# Patient Record
Sex: Male | Born: 1975 | Race: Black or African American | Hispanic: No | Marital: Single | Smoking: Current some day smoker
Health system: Southern US, Community
[De-identification: ages and names within clinical notes are randomized; demographics above are authoritative.]

---

## 2017-03-10 HISTORY — PX: CERVICAL DISC SURGERY: SHX588

## 2017-03-10 HISTORY — PX: CERVICAL SPINE SURGERY: SHX589

## 2020-03-07 ENCOUNTER — Other Ambulatory Visit: Payer: Self-pay | Admitting: Podiatry

## 2020-03-07 DIAGNOSIS — M79675 Pain in left toe(s): Secondary | ICD-10-CM

## 2020-03-16 NOTE — Patient Instructions (Signed)
Nathan Becker  03/16/2020     @PREFPERIOPPHARMACY @   Your procedure is scheduled on  03/21/2020.  Report to [facility] at  0730  A.M.  Call this number if you have problems the morning of surgery:  [phone]   Remember:  Do not eat or drink after midnight.                        Take these medicines the morning of surgery with A SIP OF WATER None    Do not wear jewelry, make-up or nail polish.  Do not wear lotions, powders, or perfumes, or deodorant. Please brush your teeth.  Do not shave 48 hours prior to surgery.  Men may shave face and neck.  Do not bring valuables to the hospital.  [facility] is not responsible for any belongings or valuables.  Contacts, dentures or bridgework may not be worn into surgery.  Leave your suitcase in the car.  After surgery it may be brought to your room.  For patients admitted to the hospital, discharge time will be determined by your treatment team.  Patients discharged the day of surgery will not be allowed to drive home.   Name and phone number of your driver:   family Special instructions:  DO NOT smoke the morning of your procedure.  Please read over the following fact sheets that you were given. Anesthesia Post-op Instructions and Care and Recovery After Surgery       Metatarsal Osteotomy, Care After This sheet gives you information about how to care for yourself after your procedure. Your health care provider may also give you more specific instructions. If you have problems or questions, contact your health care provider. What can I expect after the procedure? After the procedure, it is common to have:  Soreness.  Pain.  Stiffness.  Swelling. Follow these instructions at home: Medicines  Take over-the-counter and prescription medicines only as told by your health care provider.  Ask your health care provider if the medicine prescribed to you can cause constipation. You may need to take steps to prevent  or treat constipation, such as: ? Drink enough fluid to keep your urine pale yellow. ? Take over-the-counter or prescription medicines. ? Eat foods that are high in fiber, such as beans, whole grains, and fresh fruits and vegetables. ? Limit foods that are high in fat and processed sugars, such as fried or sweet foods. If you have a splint or walking boot:  Wear it as told by your health care provider. Remove it only as told by your health care provider.  Loosen it if your toes tingle, become numb, or turn cold and blue.  Keep it clean.  If it is not waterproof: ? Do not let it get wet. ? Cover it with a watertight covering when you take a bath or shower. Bathing  Do not take baths, swim, or use a hot tub until your health care provider approves. Ask your health care provider if you may take showers. You may only be allowed to take sponge baths.  Keep the bandage (dressing) dry. Incision care   Follow instructions from your health care provider about how to take care of your incision. Make sure you: ? Wash your hands with soap and water before you change your bandage (dressing). If soap and water are not available, use hand sanitizer. ? Change your dressing as told by your health care  provider. ? Leave stitches (sutures), skin glue, or adhesive strips in place. These skin closures may need to stay in place for 2 weeks or longer. If adhesive strip edges start to loosen and curl up, you may trim the loose edges. Do not remove adhesive strips completely unless your health care provider tells you to do that.  Check your incision area every day for signs of infection. Check for: ? More redness, swelling, or pain. ? Fluid or blood. ? Warmth. ? Pus or a bad smell. Managing pain, stiffness, and swelling   If directed, put ice on the injured area. ? If you have a removable splint, remove it as told by your health care provider. ? Put ice in a plastic bag. ? Place a towel between your  skin and the bag. ? Leave the ice on for 20 minutes, 2-3 times a day.  Move your toes often to avoid stiffness and to lessen swelling.  Raise (elevate) the injured area above the level of your heart while you are sitting or lying down. Driving  Do not drive or use heavy machinery while taking prescription pain medicine.  Do not drive for 24 hours if you were given a sedative during your procedure.  Ask your health care provider when it is safe to drive if you have a dressing, splint, special shoe, or walking boot on your foot. General instructions  Do not remove the bandage (dressing) around your foot until directed by your health care provider.  If you were given a splint, special shoe, or walking boot, wear it as told by your health care provider.  Do not use the injured limb to support your body weight until your health care provider says that you can. Use crutches or a walker as told by your health care provider.  Return to your normal activities as told by your health care provider. Ask your health care provider what activities are safe for you.  Do not use any products that contain nicotine or tobacco, such as cigarettes, e-cigarettes, and chewing tobacco. These can delay bone healing. If you need help quitting, ask your health care provider.  Keep all follow-up visits as told by your health care provider. This is important. Contact a health care provider if:  You have a fever.  Your dressing becomes wet, loose, or stained with blood or discharge.  You have pus or a bad smell coming from your incision or bandage.  Your foot becomes red, swollen, or tender.  You have pain or stiffness that does not get better or gets worse.  You have tingling or numbness in your foot that does not get better or gets worse. Get help right away if:  You develop a warm and tender swelling in your leg.  You have chest pain.  You have trouble breathing. Summary  After the procedure, it  is common to have soreness, pain, stiffness, and swelling.  Follow instructions on caring for your incision, changing your dressing, and using weight support to protect your foot.  Contact your health care provider if you have a fever, pus or a bad smell coming from your wound or dressing, or pain and stiffness do not get better.  Get help right away if you develop a warm and tender swelling in your leg, have chest pain, or trouble breathing. This information is not intended to replace advice given to you by your health care provider. Make sure you discuss any questions you have with your health care provider.  Document Revised: 01/08/2018 Document Reviewed: 01/08/2018 Elsevier Patient Education  2020 [place] After These instructions provide you with information about caring for yourself after your procedure. Your health care provider may also give you more specific instructions. Your treatment has been planned according to current medical practices, but problems sometimes occur. Call your health care provider if you have any problems or questions after your procedure. What can I expect after the procedure? After your procedure, you may:  Feel sleepy for several hours.  Feel clumsy and have poor balance for several hours.  Feel forgetful about what happened after the procedure.  Have poor judgment for several hours.  Feel nauseous or vomit.  Have a sore throat if you had a breathing tube during the procedure. Follow these instructions at home: For at least 24 hours after the procedure:      Have a responsible adult stay with you. It is important to have someone help care for you until you are awake and alert.  Rest as needed.  Do not: ? Participate in activities in which you could fall or become injured. ? Drive. ? Use heavy machinery. ? Drink alcohol. ? Take sleeping pills or medicines that cause drowsiness. ? Make important decisions  or sign legal documents. ? Take care of children on your own. Eating and drinking  Follow the diet that is recommended by your health care provider.  If you vomit, drink water, juice, or soup when you can drink without vomiting.  Make sure you have little or no nausea before eating solid foods. General instructions  Take over-the-counter and prescription medicines only as told by your health care provider.  If you have sleep apnea, surgery and certain medicines can increase your risk for breathing problems. Follow instructions from your health care provider about wearing your sleep device: ? Anytime you are sleeping, including during daytime naps. ? While taking prescription pain medicines, sleeping medicines, or medicines that make you drowsy.  If you smoke, do not smoke without supervision.  Keep all follow-up visits as told by your health care provider. This is important. Contact a health care provider if:  You keep feeling nauseous or you keep vomiting.  You feel light-headed.  You develop a rash.  You have a fever. Get help right away if:  You have trouble breathing. Summary  For several hours after your procedure, you may feel sleepy and have poor judgment.  Have a responsible adult stay with you for at least 24 hours or until you are awake and alert. This information is not intended to replace advice given to you by your health care provider. Make sure you discuss any questions you have with your health care provider. Document Revised: 05/25/2017 Document Reviewed: 06/17/2015 Elsevier Patient Education  [place]. How to Use Chlorhexidine for Bathing Chlorhexidine gluconate (CHG) is a germ-killing (antiseptic) solution that is used to clean the skin. It can get rid of the bacteria that normally live on the skin and can keep them away for about 24 hours. To clean your skin with CHG, you may be given:  A CHG solution to use in the shower or as part of a sponge  bath.  A prepackaged cloth that contains CHG. Cleaning your skin with CHG may help lower the risk for infection:  While you are staying in the intensive care unit of the hospital.  If you have a vascular access, such as a central line, to provide short-term or  long-term access to your veins.  If you have a catheter to drain urine from your bladder.  If you are on a ventilator. A ventilator is a machine that helps you breathe by moving air in and out of your lungs.  After surgery. What are the risks? Risks of using CHG include:  A skin reaction.  Hearing loss, if CHG gets in your ears.  Eye injury, if CHG gets in your eyes and is not rinsed out.  The CHG product catching fire. Make sure that you avoid smoking and flames after applying CHG to your skin. Do not use CHG:  If you have a chlorhexidine allergy or have previously reacted to chlorhexidine.  On babies younger than 67 months of age. How to use CHG solution  Use CHG only as told by your health care provider, and follow the instructions on the label.  Use the full amount of CHG as directed. Usually, this is one bottle. During a shower Follow these steps when using CHG solution during a shower (unless your health care provider gives you different instructions): 1. Start the shower. 2. Use your normal soap and shampoo to wash your face and hair. 3. Turn off the shower or move out of the shower stream. 4. Pour the CHG onto a clean washcloth. Do not use any type of brush or rough-edged sponge. 5. Starting at your neck, lather your body down to your toes. Make sure you follow these instructions: ? If you will be having surgery, pay special attention to the part of your body where you will be having surgery. Scrub this area for at least 1 minute. ? Do not use CHG on your head or face. If the solution gets into your ears or eyes, rinse them well with water. ? Avoid your genital area. ? Avoid any areas of skin that have broken  skin, cuts, or scrapes. ? Scrub your back and under your arms. Make sure to wash skin folds. 6. Let the lather sit on your skin for 1-2 minutes or as long as told by your health care provider. 7. Thoroughly rinse your entire body in the shower. Make sure that all body creases and crevices are rinsed well. 8. Dry off with a clean towel. Do not put any substances on your body afterward-such as powder, lotion, or perfume-unless you are told to do so by your health care provider. Only use lotions that are recommended by the manufacturer. 9. Put on clean clothes or pajamas. 10. If it is the night before your surgery, sleep in clean sheets.  During a sponge bath Follow these steps when using CHG solution during a sponge bath (unless your health care provider gives you different instructions): 1. Use your normal soap and shampoo to wash your face and hair. 2. Pour the CHG onto a clean washcloth. 3. Starting at your neck, lather your body down to your toes. Make sure you follow these instructions: ? If you will be having surgery, pay special attention to the part of your body where you will be having surgery. Scrub this area for at least 1 minute. ? Do not use CHG on your head or face. If the solution gets into your ears or eyes, rinse them well with water. ? Avoid your genital area. ? Avoid any areas of skin that have broken skin, cuts, or scrapes. ? Scrub your back and under your arms. Make sure to wash skin folds. 4. Let the lather sit on your skin for 1-2  minutes or as long as told by your health care provider. 5. Using a different clean, wet washcloth, thoroughly rinse your entire body. Make sure that all body creases and crevices are rinsed well. 6. Dry off with a clean towel. Do not put any substances on your body afterward-such as powder, lotion, or perfume-unless you are told to do so by your health care provider. Only use lotions that are recommended by the manufacturer. 7. Put on clean  clothes or pajamas. 8. If it is the night before your surgery, sleep in clean sheets. How to use CHG prepackaged cloths  Only use CHG cloths as told by your health care provider, and follow the instructions on the label.  Use the CHG cloth on clean, dry skin.  Do not use the CHG cloth on your head or face unless your health care provider tells you to.  When washing with the CHG cloth: ? Avoid your genital area. ? Avoid any areas of skin that have broken skin, cuts, or scrapes. Before surgery Follow these steps when using a CHG cloth to clean before surgery (unless your health care provider gives you different instructions): 1. Using the CHG cloth, vigorously scrub the part of your body where you will be having surgery. Scrub using a back-and-forth motion for 3 minutes. The area on your body should be completely wet with CHG when you are done scrubbing. 2. Do not rinse. Discard the cloth and let the area air-dry. Do not put any substances on the area afterward, such as powder, lotion, or perfume. 3. Put on clean clothes or pajamas. 4. If it is the night before your surgery, sleep in clean sheets.  For general bathing Follow these steps when using CHG cloths for general bathing (unless your health care provider gives you different instructions). 1. Use a separate CHG cloth for each area of your body. Make sure you wash between any folds of skin and between your fingers and toes. Wash your body in the following order, switching to a new cloth after each step: ? The front of your neck, shoulders, and chest. ? Both of your arms, under your arms, and your hands. ? Your stomach and groin area, avoiding the genitals. ? Your right leg and foot. ? Your left leg and foot. ? The back of your neck, your back, and your buttocks. 2. Do not rinse. Discard the cloth and let the area air-dry. Do not put any substances on your body afterward-such as powder, lotion, or perfume-unless you are told to do so  by your health care provider. Only use lotions that are recommended by the manufacturer. 3. Put on clean clothes or pajamas. Contact a health care provider if:  Your skin gets irritated after scrubbing.  You have questions about using your solution or cloth. Get help right away if:  Your eyes become very red or swollen.  Your eyes itch badly.  Your skin itches badly and is red or swollen.  Your hearing changes.  You have trouble seeing.  You have swelling or tingling in your mouth or throat.  You have trouble breathing.  You swallow any chlorhexidine. Summary  Chlorhexidine gluconate (CHG) is a germ-killing (antiseptic) solution that is used to clean the skin. Cleaning your skin with CHG may help to lower your risk for infection.  You may be given CHG to use for bathing. It may be in a bottle or in a prepackaged cloth to use on your skin. Carefully follow your health care  provider's instructions and the instructions on the product label.  Do not use CHG if you have a chlorhexidine allergy.  Contact your health care provider if your skin gets irritated after scrubbing. This information is not intended to replace advice given to you by your health care provider. Make sure you discuss any questions you have with your health care provider. Document Revised: 05/13/2018 Document Reviewed: 01/22/2017 Elsevier Patient Education  [place].

## 2020-03-19 ENCOUNTER — Other Ambulatory Visit (HOSPITAL_COMMUNITY)
Admission: RE | Admit: 2020-03-19 | Discharge: 2020-03-19 | Disposition: A | Discharge status: Home / Self Care | Payer: BC Managed Care – PPO | Source: Ambulatory Visit | Attending: Podiatry | Admitting: Podiatry

## 2020-03-19 ENCOUNTER — Encounter (HOSPITAL_COMMUNITY)
Admission: RE | Admit: 2020-03-19 | Discharge: 2020-03-19 | Disposition: A | Discharge status: Home / Self Care | Payer: BC Managed Care – PPO | Source: Ambulatory Visit | Attending: Podiatry | Admitting: Podiatry

## 2020-03-19 ENCOUNTER — Other Ambulatory Visit: Payer: Self-pay

## 2020-03-19 ENCOUNTER — Encounter (HOSPITAL_COMMUNITY): Payer: Self-pay

## 2020-03-19 DIAGNOSIS — Z01812 Encounter for preprocedural laboratory examination: Secondary | ICD-10-CM | POA: Insufficient documentation

## 2020-03-19 DIAGNOSIS — U071 COVID-19: Secondary | ICD-10-CM | POA: Diagnosis not present

## 2020-03-19 LAB — SARS CORONAVIRUS 2 (TAT 6-24 HRS): SARS Coronavirus 2: POSITIVE — AB

## 2020-03-30 ENCOUNTER — Other Ambulatory Visit: Payer: Self-pay

## 2020-03-30 ENCOUNTER — Encounter (HOSPITAL_COMMUNITY)
Admission: RE | Admit: 2020-03-30 | Discharge: 2020-03-30 | Disposition: A | Discharge status: Home / Self Care | Payer: BC Managed Care – PPO | Source: Ambulatory Visit | Attending: Podiatry | Admitting: Podiatry

## 2020-03-30 ENCOUNTER — Encounter (HOSPITAL_COMMUNITY): Payer: Self-pay

## 2020-04-04 ENCOUNTER — Ambulatory Visit (HOSPITAL_COMMUNITY): Payer: BC Managed Care – PPO | Admitting: Certified Registered"

## 2020-04-04 ENCOUNTER — Ambulatory Visit (HOSPITAL_COMMUNITY): Payer: BC Managed Care – PPO

## 2020-04-04 ENCOUNTER — Encounter (HOSPITAL_COMMUNITY)
Admission: RE | Disposition: A | Discharge status: Home / Self Care | Payer: Self-pay | Source: Home / Self Care | Attending: Podiatry

## 2020-04-04 ENCOUNTER — Ambulatory Visit (HOSPITAL_COMMUNITY)
Admission: RE | Admit: 2020-04-04 | Discharge: 2020-04-04 | Disposition: A | Discharge status: Home / Self Care | Payer: BC Managed Care – PPO | Source: Home / Self Care | Attending: Podiatry | Admitting: Podiatry

## 2020-04-04 ENCOUNTER — Ambulatory Visit (HOSPITAL_COMMUNITY)
Admission: RE | Admit: 2020-04-04 | Discharge: 2020-04-04 | Disposition: A | Discharge status: Home / Self Care | Payer: BC Managed Care – PPO | Attending: Podiatry | Admitting: Podiatry

## 2020-04-04 DIAGNOSIS — M899 Disorder of bone, unspecified: Secondary | ICD-10-CM | POA: Insufficient documentation

## 2020-04-04 DIAGNOSIS — M898X7 Other specified disorders of bone, ankle and foot: Secondary | ICD-10-CM

## 2020-04-04 DIAGNOSIS — D2372 Other benign neoplasm of skin of left lower limb, including hip: Secondary | ICD-10-CM | POA: Insufficient documentation

## 2020-04-04 DIAGNOSIS — L851 Acquired keratosis [keratoderma] palmaris et plantaris: Secondary | ICD-10-CM | POA: Diagnosis present

## 2020-04-04 DIAGNOSIS — M79675 Pain in left toe(s): Secondary | ICD-10-CM

## 2020-04-04 HISTORY — PX: BONE EXOSTOSIS EXCISION: SHX1249

## 2020-04-04 HISTORY — PX: EXCISION LESION FOOT: SHX6596

## 2020-04-04 SURGERY — EXCISION, EXOSTOSIS
Anesthesia: General | Site: Foot | Laterality: Left

## 2020-04-04 MED ORDER — FENTANYL CITRATE (PF) 100 MCG/2ML IJ SOLN
INTRAMUSCULAR | Status: AC
  Filled 2020-04-04: qty 2

## 2020-04-04 MED ORDER — LIDOCAINE HCL (PF) 1 % IJ SOLN
INTRAMUSCULAR | Status: AC
  Filled 2020-04-04: qty 30

## 2020-04-04 MED ORDER — MIDAZOLAM HCL 2 MG/2ML IJ SOLN
INTRAMUSCULAR | Status: AC
  Filled 2020-04-04: qty 2

## 2020-04-04 MED ORDER — PROPOFOL 500 MG/50ML IV EMUL
INTRAVENOUS | Status: DC | PRN
  Administered 2020-04-04: 100 ug/kg/min via INTRAVENOUS

## 2020-04-04 MED ORDER — MIDAZOLAM HCL 5 MG/5ML IJ SOLN
INTRAMUSCULAR | Status: DC | PRN
  Administered 2020-04-04: 2 mg via INTRAVENOUS

## 2020-04-04 MED ORDER — GLYCOPYRROLATE 0.2 MG/ML IJ SOLN
INTRAMUSCULAR | Status: DC | PRN
  Administered 2020-04-04: .2 mg via INTRAVENOUS

## 2020-04-04 MED ORDER — ONDANSETRON HCL 4 MG/2ML IJ SOLN: 4.0000 mg | Freq: Once | INTRAMUSCULAR | Status: DC | PRN

## 2020-04-04 MED ORDER — FENTANYL CITRATE (PF) 100 MCG/2ML IJ SOLN
INTRAMUSCULAR | Status: DC | PRN
  Administered 2020-04-04: 100 ug via INTRAVENOUS

## 2020-04-04 MED ORDER — CHLORHEXIDINE GLUCONATE CLOTH 2 % EX PADS: 6.0000 | MEDICATED_PAD | Freq: Once | CUTANEOUS | Status: DC

## 2020-04-04 MED ORDER — LACTATED RINGERS IV SOLN: INTRAVENOUS | Status: DC

## 2020-04-04 MED ORDER — GLYCOPYRROLATE PF 0.2 MG/ML IJ SOSY
PREFILLED_SYRINGE | INTRAMUSCULAR | Status: AC
  Filled 2020-04-04: qty 1

## 2020-04-04 MED ORDER — LIDOCAINE HCL 1 % IJ SOLN
INTRAMUSCULAR | Status: DC | PRN
  Administered 2020-04-04: 10 mL via INTRAMUSCULAR

## 2020-04-04 MED ORDER — CHLORHEXIDINE GLUCONATE 0.12 % MT SOLN
15.0000 mL | Freq: Once | OROMUCOSAL | Status: AC
  Administered 2020-04-04: 15 mL via OROMUCOSAL

## 2020-04-04 MED ORDER — PROPOFOL 10 MG/ML IV BOLUS
INTRAVENOUS | Status: DC | PRN
  Administered 2020-04-04: 60 mg via INTRAVENOUS

## 2020-04-04 MED ORDER — CEFAZOLIN SODIUM-DEXTROSE 2-4 GM/100ML-% IV SOLN
2.0000 g | INTRAVENOUS | Status: AC
  Administered 2020-04-04: 2 g via INTRAVENOUS

## 2020-04-04 MED ORDER — FENTANYL CITRATE (PF) 100 MCG/2ML IJ SOLN: 25.0000 ug | INTRAMUSCULAR | Status: DC | PRN

## 2020-04-04 MED ORDER — ORAL CARE MOUTH RINSE: 15.0000 mL | Freq: Once | OROMUCOSAL | Status: AC

## 2020-04-04 MED ORDER — 0.9 % SODIUM CHLORIDE (POUR BTL) OPTIME
TOPICAL | Status: DC | PRN
  Administered 2020-04-04: 1000 mL

## 2020-04-04 MED ORDER — BUPIVACAINE HCL (PF) 0.5 % IJ SOLN
INTRAMUSCULAR | Status: AC
  Filled 2020-04-04: qty 30

## 2020-04-04 SURGICAL SUPPLY — 50 items
APL PRP STRL LF DISP 70% ISPRP (MISCELLANEOUS) ×2
APL SKNCLS STERI-STRIP NONHPOA (GAUZE/BANDAGES/DRESSINGS) ×2
BANDAGE ELASTIC 4 VELCRO NS (GAUZE/BANDAGES/DRESSINGS) ×3 IMPLANT
BANDAGE ESMARK 4X12 BL STRL LF (DISPOSABLE) ×2 IMPLANT
BENZOIN TINCTURE PRP APPL 2/3 (GAUZE/BANDAGES/DRESSINGS) ×3 IMPLANT
BLADE AVERAGE 25X9 (BLADE) ×3 IMPLANT
BLADE OSC/SAG 18.5X9 THN (BLADE) ×3 IMPLANT
BLADE SURG 15 STRL LF DISP TIS (BLADE) ×2 IMPLANT
BLADE SURG 15 STRL SS (BLADE) ×3
BNDG CMPR 12X4 ELC STRL LF (DISPOSABLE) ×2
BNDG CMPR STD VLCR NS LF 5.8X4 (GAUZE/BANDAGES/DRESSINGS) ×2
BNDG CMPR STD VLCR NS LF 5.8X6 (GAUZE/BANDAGES/DRESSINGS)
BNDG CONFORM 2 STRL LF (GAUZE/BANDAGES/DRESSINGS) ×3 IMPLANT
BNDG ELASTIC 4X5.8 VLCR NS LF (GAUZE/BANDAGES/DRESSINGS) ×3 IMPLANT
BNDG ELASTIC 6X5.8 VLCR NS LF (GAUZE/BANDAGES/DRESSINGS) IMPLANT
BNDG ESMARK 4X12 BLUE STRL LF (DISPOSABLE) ×3
BNDG GAUZE ELAST 4 BULKY (GAUZE/BANDAGES/DRESSINGS) ×3 IMPLANT
CHLORAPREP W/TINT 26 (MISCELLANEOUS) ×3 IMPLANT
CLOTH BEACON ORANGE TIMEOUT ST (SAFETY) ×3 IMPLANT
COVER LIGHT HANDLE STERIS (MISCELLANEOUS) ×6 IMPLANT
COVER MAYO STAND XLG (MISCELLANEOUS) ×3 IMPLANT
COVER WAND RF STERILE (DRAPES) ×3 IMPLANT
CUFF TOURN SGL QUICK 18X4 (TOURNIQUET CUFF) ×3 IMPLANT
DECANTER SPIKE VIAL GLASS SM (MISCELLANEOUS) ×6 IMPLANT
DRAPE C-ARM 42X72 X-RAY (DRAPES) ×3 IMPLANT
DRSG ADAPTIC 3X8 NADH LF (GAUZE/BANDAGES/DRESSINGS) ×3 IMPLANT
ELECT REM PT RETURN 9FT ADLT (ELECTROSURGICAL) ×3
ELECTRODE REM PT RTRN 9FT ADLT (ELECTROSURGICAL) ×2 IMPLANT
GAUZE SPONGE 4X4 12PLY STRL (GAUZE/BANDAGES/DRESSINGS) ×3 IMPLANT
GLOVE BIOGEL PI IND STRL 7.0 (GLOVE) ×4 IMPLANT
GLOVE BIOGEL PI IND STRL 7.5 (GLOVE) ×2 IMPLANT
GLOVE BIOGEL PI INDICATOR 7.0 (GLOVE) ×2
GLOVE BIOGEL PI INDICATOR 7.5 (GLOVE) ×1
GLOVE ECLIPSE 7.0 STRL STRAW (GLOVE) ×6 IMPLANT
GOWN STRL REUS W/ TWL LRG LVL3 (GOWN DISPOSABLE) ×2 IMPLANT
GOWN STRL REUS W/TWL LRG LVL3 (GOWN DISPOSABLE) ×9 IMPLANT
KIT TURNOVER KIT A (KITS) ×3 IMPLANT
MANIFOLD NEPTUNE II (INSTRUMENTS) ×3 IMPLANT
NEEDLE HYPO 25X1 1.5 SAFETY (NEEDLE) ×6 IMPLANT
NS IRRIG 1000ML POUR BTL (IV SOLUTION) ×3 IMPLANT
PACK BASIC LIMB (CUSTOM PROCEDURE TRAY) ×3 IMPLANT
PAD ARMBOARD 7.5X6 YLW CONV (MISCELLANEOUS) ×3 IMPLANT
RASP SM TEAR CROSS CUT (RASP) ×3 IMPLANT
SET BASIN LINEN APH (SET/KITS/TRAYS/PACK) ×3 IMPLANT
STRIP CLOSURE SKIN 1/2X4 (GAUZE/BANDAGES/DRESSINGS) ×3 IMPLANT
SUT PROLENE 4 0 PS 2 18 (SUTURE) ×3 IMPLANT
SUT VIC AB 2-0 CT1 27 (SUTURE) ×3
SUT VIC AB 2-0 CT1 TAPERPNT 27 (SUTURE) ×2 IMPLANT
SUT VIC AB 4-0 PS2 27 (SUTURE) ×9 IMPLANT
SYR CONTROL 10ML LL (SYRINGE) ×6 IMPLANT

## 2020-04-04 NOTE — Op Note (Signed)
04/04/2020  9:27 AM  PATIENT:  Nathan Becker  45 y.o. male  PRE-OPERATIVE DIAGNOSIS:  Metatarsalgia, left foot pain, painful keratoderma.   POST-OPERATIVE DIAGNOSIS:  left foot pain  PROCEDURE:  Procedure(s): PLANTAR CONDYLECTOMY OF LEFT 5TH METATARSAL (Left) EXCISION LESION FOOT (Left)  SURGEON:  Surgeon(s) and Role:    * Yalda Herd, Layla Barter, DPM - Primary  PHYSICIAN ASSISTANT: none  ANESTHESIA:   local and MAC  EBL: None.   LOCAL MEDICATIONS USED:  MARCAINE   , LIDOCAINE  and Amount: 10 ml  SPECIMEN:  Excision of lesion left foot. Rule out plantar wart.   DISPOSITION OF SPECIMEN:  PATHOLOGY  Materials: 2-0 Vicryl, 4-0 Vicryl, 4-0 Prolene.   TOURNIQUET:   Total Tourniquet Time Documented: Calf (Left) - 41 minutes Total: Calf (Left) - 41 minutes   Patient was brought into the operating room laid supine on the operating table. Ankle tourniquet was applied to the surgical extremity. Following IV sedation, a local block was achieved using 10 cc of mixture of 1% plain lidocaine with 0.5% marcaine. The left foot was the prepped, scrubbed and draped in aseptic manner. Using an esmarch band the tourniquet on the surgical site was inflatted at 231mHG.   Attention was directed towards the left fifth metatarsal. A dorsal 4cm linear was made over the fifth metatarsal starting from the base of the proximal phalanx and extending just pass to the fifth metatarsal head. Incision was carried down to subcutaneous tissue. Neurovascular bundles were retracted. At this time capsular incision was made over the 5th met head. Using a freere, the capsule was reflected off. At this time using a sagittal saw, the plantar condyle of the fifth met was resected. Power rasp was used to rasp down any rough edges. Fluoroscopy was used to make sure there was no spicule's left. At this time I felt that the bone was less prominent from plantar aspect of the point. Wound was flushed and irrigated. The capsule was  repaired using 2-0 Vicryl. The subcutaneous tissue was closed using 4-0 Vicryl. The skin was repaired using 4-0 Prolene.   Attention was directed towards the plantar aspect of the sub 5th met head on the left foot. There is painful hyperkeratotic lesion which was excised using a new #15 blade. It was measured to be 0.8 x 0.9 x 0.8cm. Specimen was sent to pathology to rule out plantars wart. The wound was irrigated with normal saline.   Dry sterile dressing applied. Tourniquet was deflated. Capillary refill time was brisk to all lesser toes.

## 2020-04-04 NOTE — Discharge Instructions (Signed)
.These instructions will give you an idea of what to expect after surgery and how to manage issues that may arise before your first post op office visit.  Pain Management Pain is best managed by staying ahead of it. If pain gets out of control, it is difficult to get it back under control. Local anesthesia that lasts 6-8 hours is used to numb the foot and decrease pain.  For the best pain control, take the pain medication every 4 hours for the first 2 days post op. On the third day pain medication can be taken as needed.   Post Op Nausea Nausea is common after surgery, so it is managed proactively.  If prescribed, use the prescribed nausea medication regularly for the first 2 days post op.  Bandages Do not worry if there is blood on the bandage. What looks like a lot of blood on the bandage is actually a small amount. Blood on the dressing spreads out as it is absorbed by the gauze, the same way a drop of water spreads out on a paper towel.  If the bandages feel wet or dry, stiff and uncomfortable, call the office during office hours and we will schedule a time for you to have the bandage changed.  Unless you are specifically told otherwise, we will do the first bandage change in the office.  Keep your bandage dry. If the bandage becomes wet or soiled, notify the office and we will schedule a time to change the bandage.  Activity It is best to spend most of the first 2 days after surgery lying down with the foot elevated above the level of your heart. You may put weight on your heel while wearing the surgical shoe.   You may only get up to go to the restroom.  Driving Do not drive until you are able to respond in an emergency (i.e. slam on the brakes). This usually occurs after the bone has healed - 6 to 8 weeks.  Call the Office If you have a fever over 101F.  If you have increasing pain after the initial post op pain has settled down.  If you have increasing redness, swelling, or  drainage.  If you have any questions or concerns.     General Anesthesia, Adult General anesthesia is the use of medicines to make a person "go to sleep" (unconscious) for a medical procedure. General anesthesia must be used for certain procedures, and is often recommended for procedures that:  Last a long time.  Require you to be still or in an unusual position.  Are major and can cause blood loss. The medicines used for general anesthesia are called general anesthetics. As well as making you unconscious for a certain amount of time, these medicines:  Prevent pain.  Control your blood pressure.  Relax your muscles. Tell a health care provider about:  Any allergies you have.  All medicines you are taking, including vitamins, herbs, eye drops, creams, and over-the-counter medicines.  Any problems you or family members have had with anesthetic medicines.  Types of anesthetics you have had in the past.  Any blood disorders you have.  Any surgeries you have had.  Any medical conditions you have.  Any recent upper respiratory, chest, or ear infections.  Any history of: ? Heart or lung conditions, such as heart failure, sleep apnea, asthma, or chronic obstructive pulmonary disease (COPD). ? Armed forces logistics/support/administrative officer. ? Depression or anxiety.  Any tobacco or drug use, including marijuana or alcohol use.  Whether you are pregnant or may be pregnant. What are the risks? Generally, this is a safe procedure. However, problems may occur, including:  Allergic reaction.  Lung and heart problems.  Inhaling food or liquid from the stomach into the lungs (aspiration).  Nerve injury.  Dental injury.  Air in the bloodstream, which can lead to stroke.  Extreme agitation or confusion (delirium) when you wake up from the anesthetic.  Waking up during your procedure and being unable to move. This is rare. These problems are more likely to develop if you are having a major surgery or  if you have an advanced or serious medical condition. You can prevent some of these complications by answering all of your health care provider's questions thoroughly and by following all instructions before your procedure. General anesthesia can cause side effects, including:  Nausea or vomiting.  A sore throat from the breathing tube.  Hoarseness.  Wheezing or coughing.  Shaking chills.  Tiredness.  Body aches.  Anxiety.  Sleepiness or drowsiness.  Confusion or agitation. What happens before the procedure? Staying hydrated Follow instructions from your health care provider about hydration, which may include:  Up to 2 hours before the procedure - you may continue to drink clear liquids, such as water, clear fruit juice, black coffee, and plain tea.   Eating and drinking restrictions Follow instructions from your health care provider about eating and drinking, which may include:  8 hours before the procedure - stop eating heavy meals or foods such as meat, fried foods, or fatty foods.  6 hours before the procedure - stop eating light meals or foods, such as toast or cereal.  6 hours before the procedure - stop drinking milk or drinks that contain milk.  2 hours before the procedure - stop drinking clear liquids. Medicines Ask your health care provider about:  Changing or stopping your regular medicines. This is especially important if you are taking diabetes medicines or blood thinners.  Taking medicines such as aspirin and ibuprofen. These medicines can thin your blood. Do not take these medicines unless your health care provider tells you to take them.  Taking over-the-counter medicines, vitamins, herbs, and supplements. Do not take these during the week before your procedure unless your health care provider approves them. General instructions  Starting 3-6 weeks before the procedure, do not use any products that contain nicotine or tobacco, such as cigarettes and  e-cigarettes. If you need help quitting, ask your health care provider.  If you brush your teeth on the morning of the procedure, make sure to spit out all of the toothpaste.  Tell your health care provider if you become ill or develop a cold, cough, or fever.  If instructed by your health care provider, bring your sleep apnea device with you on the day of your surgery (if applicable).  Ask your health care provider if you will be going home the same day, the following day, or after a longer hospital stay. ? Plan to have a responsible adult take you home from the hospital or clinic. ? Plan to have a responsible adult care for you for the time you are told after you leave the hospital or clinic. This is important. What happens during the procedure?  You will be given anesthetics through both of the following: ? A mask placed over your nose and mouth. ? An IV in one of your veins.  You may receive a medicine to help you relax (sedative).  After you are  unconscious, a breathing tube may be inserted down your throat to help you breathe. This will be removed before you wake up.  An anesthesia specialist will stay with you throughout your procedure. He or she will: ? Keep you comfortable and safe by continuing to give you medicines and adjusting the amount of medicine that you get. ? Monitor your blood pressure, pulse, and oxygen levels to make sure that the anesthetics do not cause any problems. The procedure may vary among health care providers and hospitals.   What happens after the procedure?  Your blood pressure, temperature, heart rate, breathing rate, and blood oxygen level will be monitored until the medicines you were given have worn off.  You will wake up in a recovery area. You may wake up slowly.  If you feel anxious or agitated, you may be given medicine to help you calm down.  If you will be going home the same day, your health care provider may check to make sure you can  walk, drink, and urinate.  Your health care provider will treat any pain or side effects you have before you go home.  Do not drive or operate machinery until your health care provider says that it is safe. Summary  General anesthesia is used to keep you still and prevent pain during a procedure.  It is important to tell your health care provider about your medical history and any surgeries you have had, and previous experience with anesthesia.  Follow your health care provider's instructions about when to stop eating, drinking, or taking certain medicines before your procedure.  Plan to have a responsible adult take you home from the hospital or clinic. This information is not intended to replace advice given to you by your health care provider. Make sure you discuss any questions you have with your health care provider. Document Revised: 11/07/2019 Document Reviewed: 06/08/2019 Elsevier Patient Education  2021 [employer].

## 2020-04-04 NOTE — Anesthesia Postprocedure Evaluation (Signed)
Anesthesia Post Note  Patient: Nathan Becker  Procedure(s) Performed: PLANTAR CONDYLECTOMY OF LEFT 5TH METATARSAL (Left Fifth Toe) EXCISION LESION FOOT (Left Foot)  Patient location during evaluation: PACU Anesthesia Type: MAC Level of consciousness: awake, oriented, awake and alert and patient cooperative Pain management: pain level controlled Vital Signs Assessment: post-procedure vital signs reviewed and stable Respiratory status: spontaneous breathing, respiratory function stable and nonlabored ventilation Cardiovascular status: blood pressure returned to baseline and stable Postop Assessment: no headache and no backache Anesthetic complications: no   No complications documented.   Last Vitals:  Vitals:   04/04/20 0821  BP: 126/89  Pulse: 78  Resp: (!) 78  Temp: 36.4 C  SpO2: 98%    Last Pain:  Vitals:   04/04/20 0821  TempSrc: Oral  PainSc: 0-No pain                 Tacy Learn

## 2020-04-04 NOTE — H&P (Signed)
.   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  The various methods of treatment have been discussed with the patient.  No guarantees were given.  After consideration of risks, benefits and other options for treatment, the patient has consented to surgery.  I have reviewed the patients' chart and labs.    Patient Vitals for the past 24 hrs:  BP Temp Temp src Pulse Resp SpO2 Height Weight  04/04/20 0821 126/89 97.6 F (36.4 C) Oral 78 (!) 78 98 % 5\' 10"  (1.778 m) 61.2 kg    A history and physical examination was performed in my office.  The patient was reexamined.  There have been no changes to this history and physical examination.  Tyson Babinski, DPM

## 2020-04-04 NOTE — Transfer of Care (Signed)
Immediate Anesthesia Transfer of Care Note  Patient: Nathan Becker  Procedure(s) Performed: PLANTAR CONDYLECTOMY OF LEFT 5TH METATARSAL (Left Fifth Toe) EXCISION LESION FOOT (Left Foot)  Patient Location: PACU  Anesthesia Type:MAC  Level of Consciousness: awake, alert , oriented and patient cooperative  Airway & Oxygen Therapy: Patient Spontanous Breathing  Post-op Assessment: Report given to RN, Post -op Vital signs reviewed and stable and Patient moving all extremities  Post vital signs: Reviewed and stable  Last Vitals:  Vitals Value Taken Time  BP    Temp    Pulse    Resp 13 04/04/20 0930  SpO2    Vitals shown include unvalidated device data.  Last Pain:  Vitals:   04/04/20 0821  TempSrc: Oral  PainSc: 0-No pain      Patients Stated Pain Goal: 5 (24/82/50 0370)  Complications: No complications documented.

## 2020-04-04 NOTE — Brief Op Note (Signed)
04/04/2020  9:27 AM  PATIENT:  Nathan Becker  45 y.o. male  PRE-OPERATIVE DIAGNOSIS:  left foot pain  POST-OPERATIVE DIAGNOSIS:  left foot pain  PROCEDURE:  Procedure(s): PLANTAR CONDYLECTOMY OF LEFT 5TH METATARSAL (Left) EXCISION LESION FOOT (Left)  SURGEON:  Surgeon(s) and Role:    * Tyson Babinski, DPM - Primary  PHYSICIAN ASSISTANT: none    ANESTHESIA:   local and MAC  EBL: None.   BLOOD ADMINISTERED:none  DRAINS: none   LOCAL MEDICATIONS USED:  MARCAINE   , LIDOCAINE  and Amount: 10 ml  SPECIMEN:  Excision of lesion left foot. Rule out plantar wart.   DISPOSITION OF SPECIMEN:  PATHOLOGY  COUNTS:  YES  TOURNIQUET:   Total Tourniquet Time Documented: Calf (Left) - 41 minutes Total: Calf (Left) - 41 minutes   DICTATION: .Viviann Spare Dictation  PLAN OF CARE: Discharge to home after PACU  PATIENT DISPOSITION:  PACU - hemodynamically stable.   Delay start of Pharmacological VTE agent (>24hrs) due to surgical blood loss or risk of bleeding: not applicable

## 2020-04-04 NOTE — Anesthesia Preprocedure Evaluation (Signed)
Anesthesia Evaluation  Patient identified by MRN, date of birth, ID band Patient awake    Reviewed: Allergy & Precautions, H&P , NPO status , Patient's Chart, lab work & pertinent test results, reviewed documented beta blocker date and time   Airway Mallampati: II  TM Distance: >3 FB Neck ROM: full    Dental no notable dental hx.    Pulmonary neg pulmonary ROS, Current Smoker,    Pulmonary exam normal breath sounds clear to auscultation       Cardiovascular Exercise Tolerance: Good negative cardio ROS   Rhythm:regular Rate:Normal     Neuro/Psych negative neurological ROS  negative psych ROS   GI/Hepatic negative GI ROS, Neg liver ROS,   Endo/Other  negative endocrine ROS  Renal/GU negative Renal ROS  negative genitourinary   Musculoskeletal   Abdominal   Peds  Hematology negative hematology ROS (+)   Anesthesia Other Findings   Reproductive/Obstetrics negative OB ROS                             Anesthesia Physical Anesthesia Plan  ASA: I  Anesthesia Plan: General   Post-op Pain Management:    Induction:   PONV Risk Score and Plan: Propofol infusion  Airway Management Planned:   Additional Equipment:   Intra-op Plan:   Post-operative Plan:   Informed Consent: I have reviewed the patients History and Physical, chart, labs and discussed the procedure including the risks, benefits and alternatives for the proposed anesthesia with the patient or authorized representative who has indicated his/her understanding and acceptance.     Dental Advisory Given  Plan Discussed with: CRNA  Anesthesia Plan Comments:         Anesthesia Quick Evaluation  

## 2020-04-06 ENCOUNTER — Encounter (HOSPITAL_COMMUNITY): Payer: Self-pay | Admitting: Podiatry

## 2020-04-06 ENCOUNTER — Other Ambulatory Visit: Payer: Self-pay

## 2020-04-06 LAB — SURGICAL PATHOLOGY

## 2021-10-01 IMAGING — DX DG FOOT COMPLETE 3+V*L*
3 series · 3 of 3 positions shown · non-contrast
Comparison: None.

CLINICAL DATA: Left foot pain, callus

EXAM:
LEFT FOOT - COMPLETE 3+ VIEW

[foot ap]
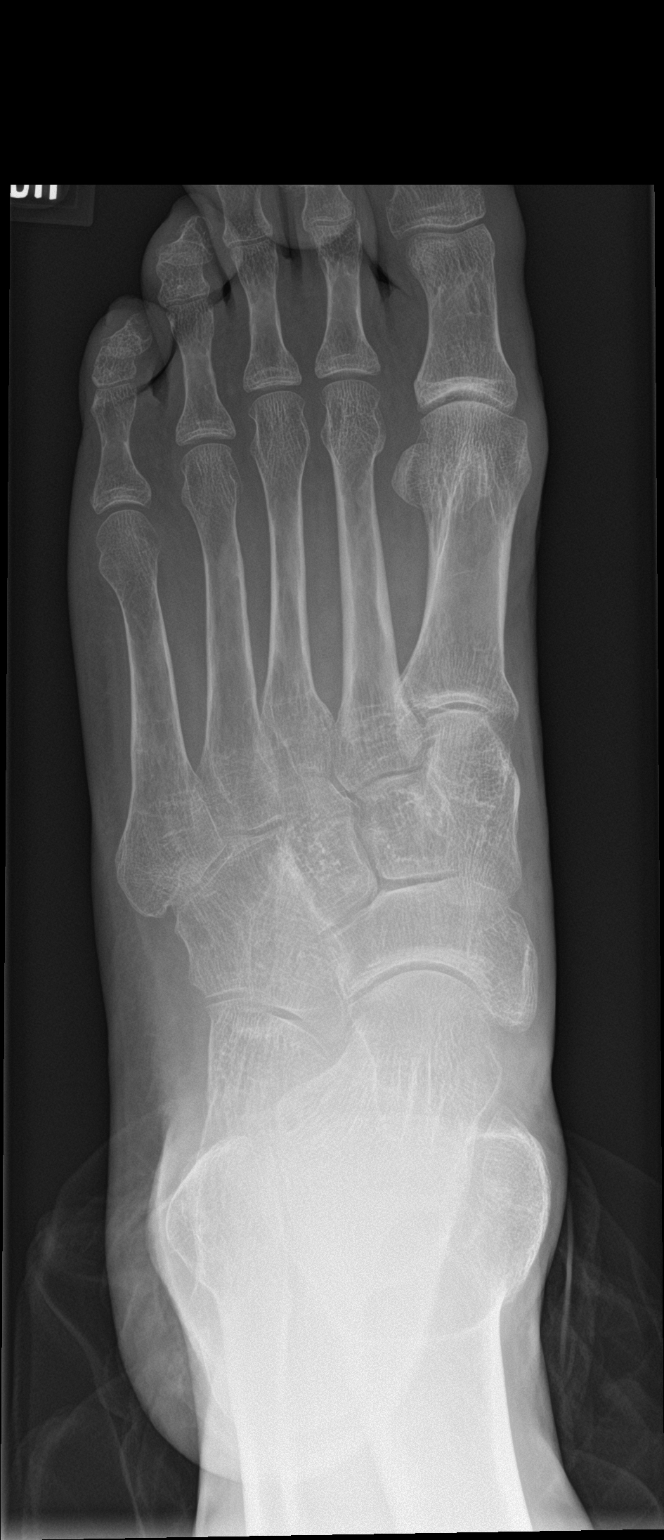

[foot obl]
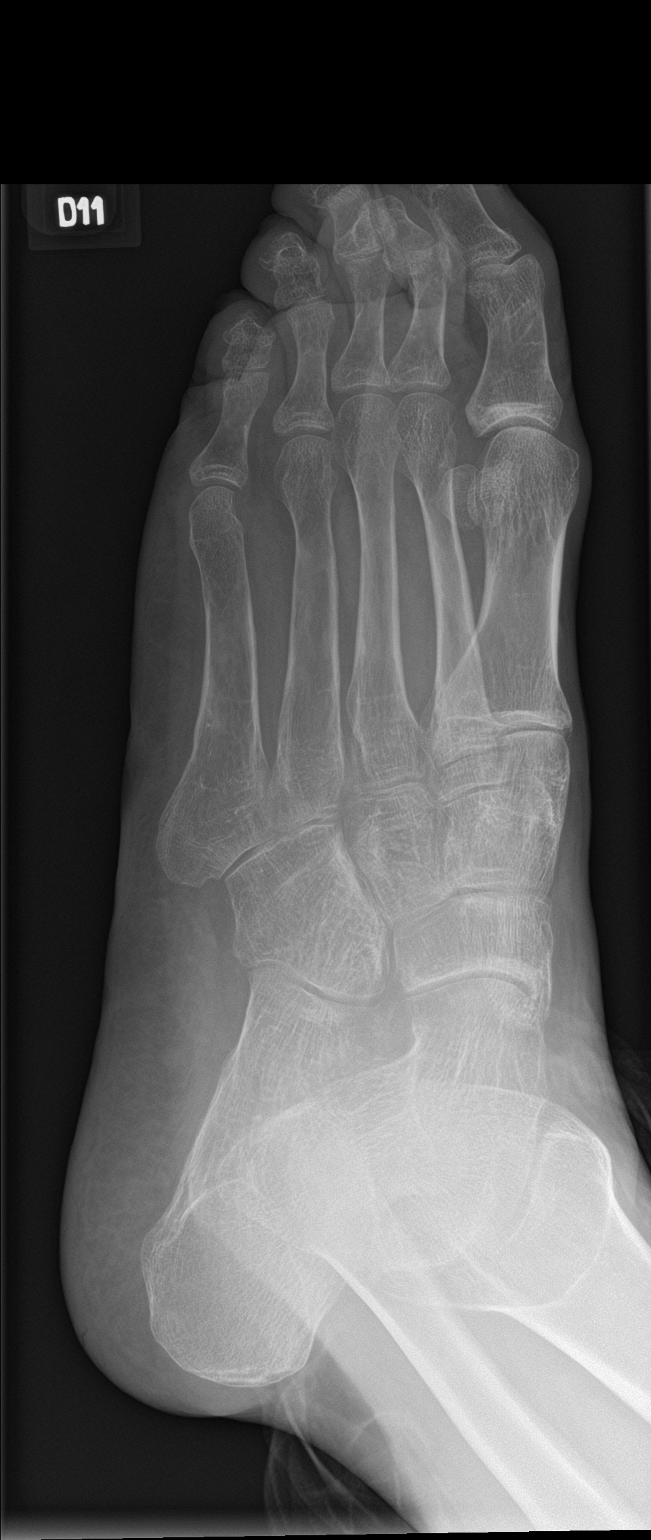

[foot lat]
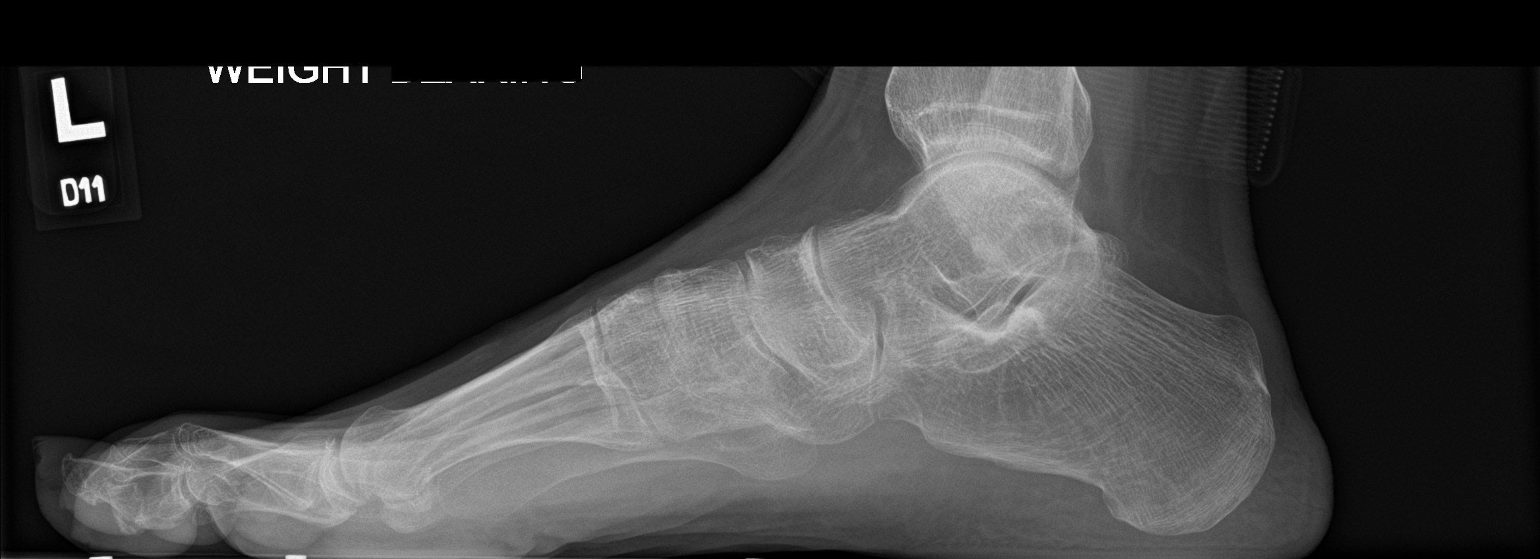

[3 of 3 positions shown; findings below may reference images not displayed]

FINDINGS: There is no evidence of fracture or dislocation. Flexion deformity
of the second toe distal interphalangeal joint. Mild joint space
narrowing within the midfoot, most evident at the talonavicular
joint. No bony erosion or periosteal elevation. No focal soft tissue
swelling.
IMPRESSION: 1. Mild degenerative changes of the left foot.
2. Flexion deformity of the second toe distal interphalangeal joint.

## 2021-10-01 IMAGING — DX DG C-ARM 1-60 MIN
3 series · 3 of 3 positions shown · non-contrast
Comparison: Radiographs same date.

CLINICAL DATA: Exostosis of left 5th metatarsal.

EXAM:
LEFT FOOT - 2 VIEW; DG C-ARM 1-60 MIN

[foot ap]
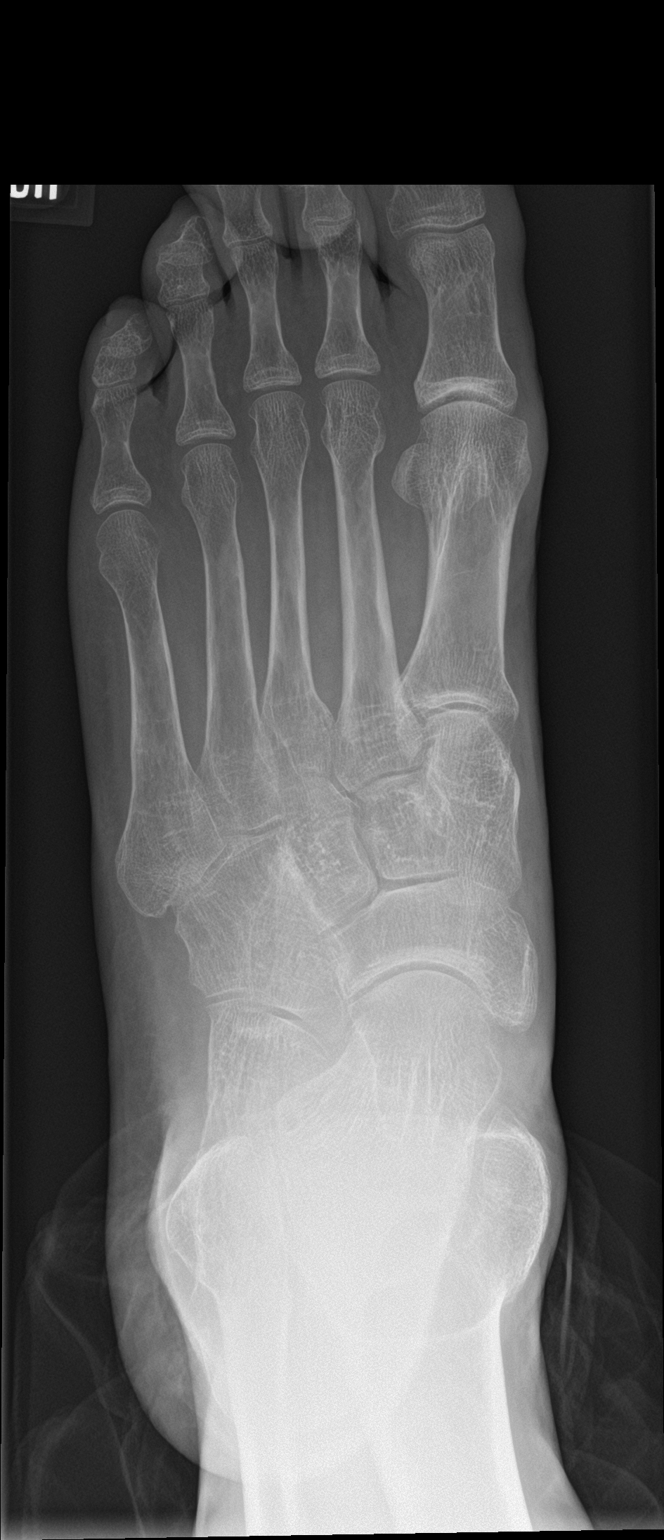

[foot obl]
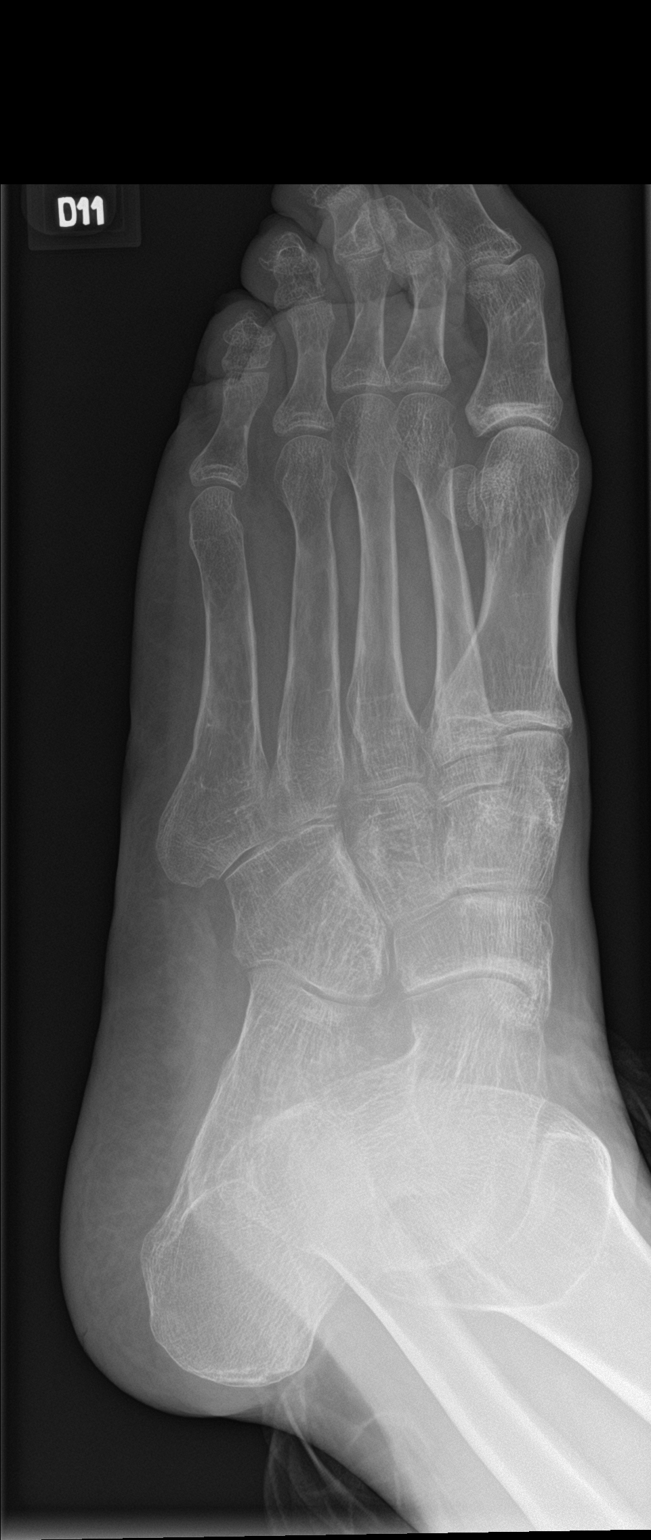

[foot lat]
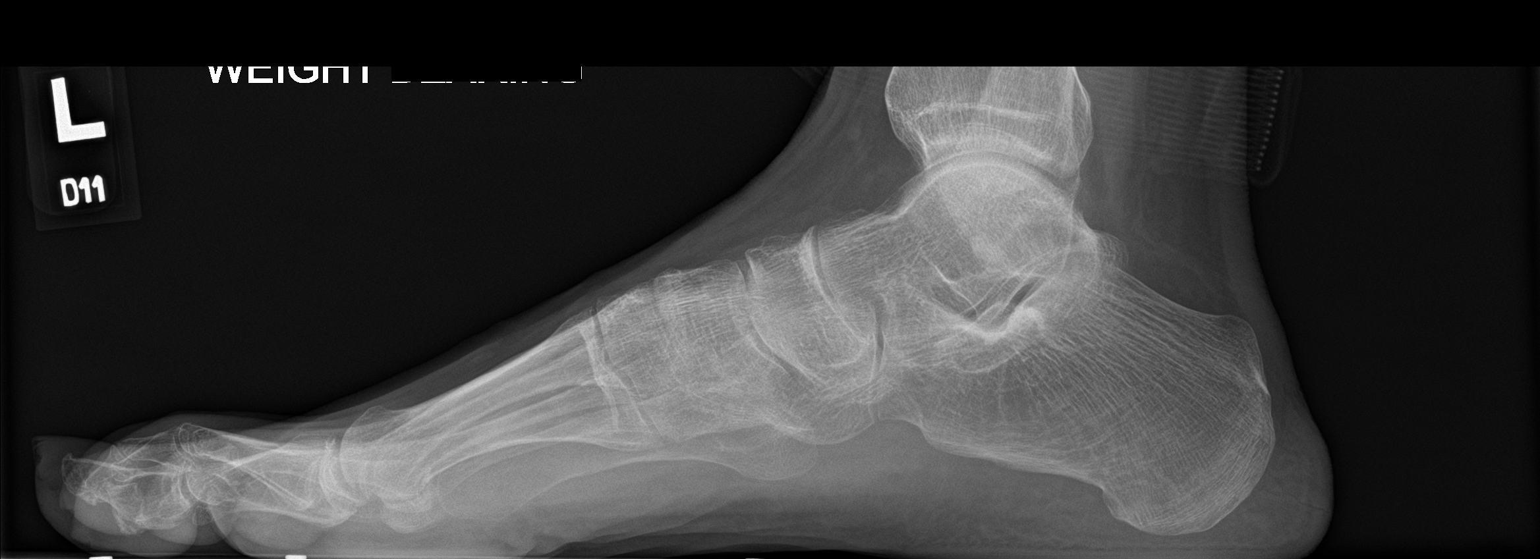

[3 of 3 positions shown; findings below may reference images not displayed]

FINDINGS: C-arm fluoroscopy was provided.  8 seconds of fluoroscopy time.

Two spot fluoroscopic images of the forefoot are submitted. The
lateral image is motion degraded. No focal osseous abnormality
identified on the spot images.
IMPRESSION: Intraoperative radiographs as described.
